# Patient Record
Sex: Male | Born: 1985 | Race: White | Hispanic: No | Marital: Married | Smoking: Never smoker
Health system: Southern US, Community
[De-identification: ages and names within clinical notes are randomized; demographics above are authoritative.]

---
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
## 2004-03-18 ENCOUNTER — Emergency Department (HOSPITAL_COMMUNITY): Admission: EM | Admit: 2004-03-18 | Discharge: 2004-03-18 | Payer: Self-pay | Admitting: Emergency Medicine

## 2004-05-13 ENCOUNTER — Emergency Department (HOSPITAL_COMMUNITY): Admission: EM | Admit: 2004-05-13 | Discharge: 2004-05-13 | Payer: Self-pay | Admitting: Emergency Medicine

## 2006-09-21 ENCOUNTER — Encounter: Payer: Self-pay | Admitting: Emergency Medicine

## 2006-09-21 ENCOUNTER — Observation Stay (HOSPITAL_COMMUNITY): Admission: EM | Admit: 2006-09-21 | Discharge: 2006-09-22 | Payer: Self-pay | Admitting: Emergency Medicine
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
## 2012-03-22 ENCOUNTER — Emergency Department (HOSPITAL_COMMUNITY): Payer: Self-pay

## 2012-03-22 ENCOUNTER — Encounter (HOSPITAL_COMMUNITY): Payer: Self-pay | Admitting: *Deleted

## 2012-03-22 ENCOUNTER — Emergency Department (HOSPITAL_COMMUNITY)
Admission: EM | Admit: 2012-03-22 | Discharge: 2012-03-22 | Disposition: A | Discharge status: Home / Self Care | Payer: Self-pay | Attending: Emergency Medicine | Admitting: Emergency Medicine

## 2012-03-22 DIAGNOSIS — N132 Hydronephrosis with renal and ureteral calculous obstruction: Secondary | ICD-10-CM

## 2012-03-22 DIAGNOSIS — N509 Disorder of male genital organs, unspecified: Secondary | ICD-10-CM | POA: Insufficient documentation

## 2012-03-22 DIAGNOSIS — N201 Calculus of ureter: Secondary | ICD-10-CM | POA: Insufficient documentation

## 2012-03-22 DIAGNOSIS — R8271 Bacteriuria: Secondary | ICD-10-CM

## 2012-03-22 DIAGNOSIS — N1330 Unspecified hydronephrosis: Secondary | ICD-10-CM | POA: Insufficient documentation

## 2012-03-22 DIAGNOSIS — R82998 Other abnormal findings in urine: Secondary | ICD-10-CM | POA: Insufficient documentation

## 2012-03-22 DIAGNOSIS — R112 Nausea with vomiting, unspecified: Secondary | ICD-10-CM | POA: Insufficient documentation

## 2012-03-22 LAB — URINE MICROSCOPIC-ADD ON

## 2012-03-22 LAB — URINALYSIS, ROUTINE W REFLEX MICROSCOPIC
Bilirubin Urine: NEGATIVE
Glucose, UA: NEGATIVE mg/dL
Ketones, ur: NEGATIVE mg/dL
pH: 6 (ref 5.0–8.0)

## 2012-03-22 MED ORDER — ONDANSETRON HCL 4 MG/2ML IJ SOLN
INTRAMUSCULAR | Status: AC
  Administered 2012-03-22: 4 mg via INTRAVENOUS
  Filled 2012-03-22: qty 2

## 2012-03-22 MED ORDER — HYDROMORPHONE HCL PF 1 MG/ML IJ SOLN
1.0000 mg | Freq: Once | INTRAMUSCULAR | Status: AC
  Administered 2012-03-22: 1 mg via INTRAVENOUS

## 2012-03-22 MED ORDER — HYDROMORPHONE HCL PF 1 MG/ML IJ SOLN
INTRAMUSCULAR | Status: AC
  Administered 2012-03-22: 1 mg via INTRAVENOUS
  Filled 2012-03-22: qty 1

## 2012-03-22 MED ORDER — ONDANSETRON HCL 4 MG/2ML IJ SOLN
INTRAMUSCULAR | Status: AC
  Filled 2012-03-22: qty 2

## 2012-03-22 MED ORDER — CIPROFLOXACIN HCL 500 MG PO TABS: 500.0000 mg | ORAL_TABLET | Freq: Two times a day (BID) | ORAL | Status: AC

## 2012-03-22 MED ORDER — ONDANSETRON HCL 4 MG/2ML IJ SOLN
4.0000 mg | Freq: Once | INTRAMUSCULAR | Status: AC
  Administered 2012-03-22: 4 mg via INTRAVENOUS

## 2012-03-22 MED ORDER — ONDANSETRON HCL 8 MG PO TABS: 8.0000 mg | ORAL_TABLET | Freq: Three times a day (TID) | ORAL | Status: AC | PRN

## 2012-03-22 MED ORDER — OXYCODONE-ACETAMINOPHEN 5-325 MG PO TABS: 1.0000 | ORAL_TABLET | ORAL | Status: AC | PRN

## 2012-03-22 MED ORDER — ONDANSETRON HCL 4 MG/2ML IJ SOLN: 4.0000 mg | Freq: Once | INTRAMUSCULAR | Status: DC

## 2012-03-22 MED ORDER — KETOROLAC TROMETHAMINE 30 MG/ML IJ SOLN
30.0000 mg | Freq: Once | INTRAMUSCULAR | Status: AC
  Administered 2012-03-22: 30 mg via INTRAVENOUS
  Filled 2012-03-22: qty 1

## 2012-03-22 NOTE — ED Notes (Signed)
Left flank pain that started this morning with n/v.  Denies hx of kidney stone.

## 2012-03-22 NOTE — ED Notes (Signed)
Pt unable to void at this time.  nad noted.  

## 2012-03-22 NOTE — ED Provider Notes (Signed)
History     CSN: [id number]  Arrival date & time 03/22/12  4098   First MD Initiated Contact with Patient 03/22/12 [phone]      Chief Complaint  Patient presents with  . Flank Pain    (Consider location/radiation/quality/duration/timing/severity/associated sxs/prior treatment) HPI Comments: Martin Palmer presents with sudden onset, constant left flank pain which is now radiating into his left testicle.  He has nausea with emesis.  He has not been able to urinate since the symptoms started.  He denies fevers, chills, abdominal pain pain.  He denies a prior history or family history of kidney stones and denies penile discharge or scrotal swelling.  Patient is a 26 y.o. male presenting with flank pain. The history is provided by the patient.  Flank Pain This is a new problem. The current episode started today. The problem occurs constantly. The problem has been unchanged. Associated symptoms include nausea and vomiting. Pertinent negatives include no abdominal pain, arthralgias, chest pain, chills, congestion, fever, headaches, joint swelling, neck pain, numbness, rash, sore throat or weakness. He has tried nothing for the symptoms.    History reviewed. No pertinent past medical history.  History reviewed. No pertinent past surgical history.  No family history on file.  History  Substance Use Topics  . Smoking status: Never Smoker   . Smokeless tobacco: Not on file  . Alcohol Use: No      Review of Systems  Constitutional: Negative for fever and chills.  HENT: Negative for congestion, sore throat and neck pain.   Eyes: Negative.   Respiratory: Negative for chest tightness and shortness of breath.   Cardiovascular: Negative for chest pain.  Gastrointestinal: Positive for nausea and vomiting. Negative for abdominal pain.  Genitourinary: Positive for flank pain, difficulty urinating and testicular pain.  Musculoskeletal: Negative for joint swelling and arthralgias.  Skin:  Negative.  Negative for rash and wound.  Neurological: Negative for dizziness, weakness, light-headedness, numbness and headaches.  Hematological: Negative.   Psychiatric/Behavioral: Negative.     Allergies  Review of patient's allergies indicates no known allergies.  Home Medications   Current Outpatient Rx  Name  Route  Sig  Dispense  Refill  . CIPROFLOXACIN HCL 500 MG PO TABS   Oral   Take 1 tablet (500 mg total) by mouth every 12 (twelve) hours.   14 tablet   0   . ONDANSETRON HCL 8 MG PO TABS   Oral   Take 1 tablet (8 mg total) by mouth every 8 (eight) hours as needed for nausea.   12 tablet   0   . OXYCODONE-ACETAMINOPHEN 5-325 MG PO TABS   Oral   Take 1 tablet by mouth every 4 (four) hours as needed for pain.   20 tablet   0     BP 123/68  Pulse 50  Temp 97.8 F (36.6 C) (Oral)  Resp 16  Ht 5\' 7"  (1.702 m)  Wt 180 lb (81.647 kg)  BMI 28.19 kg/m2  SpO2 97%  Physical Exam  Nursing note and vitals reviewed. Constitutional: He appears well-developed and well-nourished.  HENT:  Head: Normocephalic and atraumatic.  Eyes: Conjunctivae normal are normal.  Neck: Normal range of motion.  Cardiovascular: Normal rate, regular rhythm, normal heart sounds and intact distal pulses.   Pulmonary/Chest: Effort normal and breath sounds normal. He has no wheezes.  Abdominal: Soft. Bowel sounds are normal. He exhibits no mass. There is tenderness. There is CVA tenderness. There is no rebound and no guarding.  Genitourinary: Testes normal.  Musculoskeletal: Normal range of motion.  Neurological: He is alert.  Skin: Skin is warm and dry.  Psychiatric: He has a normal mood and affect.    ED Course  Procedures (including critical care time)  Labs Reviewed  URINALYSIS, ROUTINE W REFLEX MICROSCOPIC - Abnormal; Notable for the following:    Specific Gravity, Urine >1.030 (*)     Hgb urine dipstick LARGE (*)     All other components within normal limits  URINE  MICROSCOPIC-ADD ON - Abnormal; Notable for the following:    Bacteria, UA FEW (*)     All other components within normal limits  URINE CULTURE   Ct Abdomen Pelvis Wo Contrast  03/22/2012  *RADIOLOGY REPORT*  Clinical Data: left flank pain  CT ABDOMEN AND PELVIS WITHOUT CONTRAST  Technique:  Multidetector CT imaging of the abdomen and pelvis was performed following the standard protocol without intravenous contrast.  Comparison: None.  Findings: The lung bases are clear.  Normal heart size.  No pericardial or pleural effusion.  Negative for hiatal hernia.  Abdomen: Left kidney demonstrates mild hydronephrosis and hydroureter.  This is secondary to a left distal UVJ obstructing calculus in the pelvis measuring 3 mm, image 76.  No other urinary tract demonstrated.  Right kidney and ureter are unremarkable.  Liver, gallbladder, biliary system, pancreas, spleen, and adrenal glands are within normal limits for noncontrast study.  Negative for bowel obstruction, dilatation, ileus, or free air.  No abdominal free fluid, fluid collection, hemorrhage, abscess, or adenopathy.  Normal appendix demonstrated.  Pelvis:  3 mm left UVJ obstructing calculus again demonstrated.  No free fluid, fluid collection, hemorrhage, abscess or adenopathy. No inguinal abnormality, or hernia.  No acute distal bowel process.  IMPRESSION: 3 mm left UVJ mildly obstructing calculus with associated left hydronephrosis and hydroureter.   Original Report Authenticated By: Judie Petit. Shick, M.D.      1. Ureteral stone with hydronephrosis   2. Bacteriuria with pyuria       MDM  Pt prescribed cipro,  Oxycodone,  Zofran.  Kidney stone instructions given,  Referral to Dr Jerre Simon, urine strainer given.  Return here for worse pain,  Fever, uncontrolled vomiting.  Urine cx pending.        Burgess Amor, [place] 03/22/12 [phone]

## 2012-03-23 LAB — URINE CULTURE: Colony Count: NO GROWTH

## 2012-03-23 NOTE — ED Provider Notes (Signed)
Medical screening examination/treatment/procedure(s) were performed by non-physician practitioner and as supervising physician I was immediately available for consultation/collaboration.   Benny Lennert, MD 03/23/12 1537

## 2014-12-22 ENCOUNTER — Ambulatory Visit
Admission: EM | Admit: 2014-12-22 | Discharge: 2014-12-22 | Disposition: A | Discharge status: Home / Self Care | Payer: 59 | Attending: Family Medicine | Admitting: Family Medicine

## 2014-12-22 DIAGNOSIS — R110 Nausea: Secondary | ICD-10-CM

## 2014-12-22 MED ORDER — ONDANSETRON HCL 4 MG/2ML IJ SOLN: 8.0000 mg | Freq: Once | INTRAMUSCULAR | Status: DC

## 2014-12-22 MED ORDER — ONDANSETRON 8 MG PO TBDP
8.0000 mg | ORAL_TABLET | Freq: Once | ORAL | Status: AC
  Administered 2014-12-22: 8 mg via ORAL
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
## 2014-12-24 ENCOUNTER — Ambulatory Visit
Admission: EM | Admit: 2014-12-24 | Discharge: 2014-12-24 | Disposition: A | Discharge status: Home / Self Care | Payer: 59 | Attending: Family Medicine | Admitting: Family Medicine

## 2014-12-24 ENCOUNTER — Encounter: Payer: Self-pay | Admitting: Emergency Medicine

## 2014-12-24 DIAGNOSIS — Z113 Encounter for screening for infections with a predominantly sexual mode of transmission: Secondary | ICD-10-CM

## 2014-12-24 LAB — CHLAMYDIA/NGC RT PCR (ARMC ONLY)
Chlamydia Tr: NOT DETECTED
N gonorrhoeae: NOT DETECTED

## 2014-12-24 NOTE — ED Provider Notes (Signed)
[facility] Emergency Department Provider Note  ____________________________________________  Time seen: Approximately 2:24 PM  I have reviewed the triage vital signs and the nursing notes.   HISTORY  Chief Complaint Exposure to STD    HPI KILE KABLER is a 29 y.o. male presents for request of STD testing. Patient reports that 1.5 weeks ago on last Wednesday he was drinking alcohol and received oral sex. States he only received oral sex. States no intercourse, giving of oral sex, rectal involvement or other sexual activity. States this occurred once.   Reports he was seen here at Urgent Care 2 days ago for same and was experiencing nausea with same as he was nervous, but states nausea resolved.  Denies complaints. Denies pain, dysuria, penile discharge, rash, lesions, or other complaints. Denies fever.     History reviewed. No pertinent past medical history.  There are no active problems to display for this patient.   History reviewed. No pertinent past surgical history.  Current Outpatient Rx  Name  Route  Sig  Dispense  Refill  .           Marland Kitchen             Allergies Review of patient's allergies indicates no known allergies.  History reviewed. No pertinent family history.  Social History Social History  Substance Use Topics  . Smoking status: Never Smoker   . Smokeless tobacco: None  . Alcohol Use: 0.6 oz/week    1 Cans of beer per week    Review of Systems Constitutional: No fever/chills Eyes: No visual changes. ENT: No sore throat. Cardiovascular: Denies chest pain. Respiratory: Denies shortness of breath. Gastrointestinal: No abdominal pain.  No nausea, no vomiting.  No diarrhea.  No constipation. Genitourinary: Negative for dysuria. Musculoskeletal: Negative for back pain. Skin: Negative for rash. Neurological: Negative for headaches, focal weakness or numbness.  10-point ROS otherwise  negative.  ____________________________________________   PHYSICAL EXAM:  VITAL SIGNS: ED Triage Vitals  Enc Vitals Group     BP 12/24/14 1355 115/92 mmHg     Pulse Rate 12/24/14 1355 69     Resp 12/24/14 1355 18     Temp 12/24/14 1355 98 F (36.7 C)     Temp Source 12/24/14 1355 Tympanic     SpO2 12/24/14 1355 100 %     Weight 12/24/14 1355 188 lb (85.276 kg)     Height 12/24/14 1355 5\' 6"  (1.676 m)     Head Cir --      Peak Flow --      Pain Score --      Pain Loc --      Pain Edu? --      Excl. in GC? --     Constitutional: Alert and oriented. Well appearing and in no acute distress. Eyes: Conjunctivae are normal. PERRL. EOMI. Head: Atraumatic.  Nose: No congestion/rhinnorhea.  Mouth/Throat: Mucous membranes are moist.  Oropharynx non-erythematous. Neck: No stridor.  No cervical spine tenderness to palpation. Hematological/Lymphatic/Immunilogical: No cervical lymphadenopathy. Cardiovascular: Normal rate, regular rhythm. Grossly normal heart sounds.  Good peripheral circulation. Respiratory: Normal respiratory effort.  No retractions. Lungs CTAB. Gastrointestinal: Soft and nontender. No distention. Normal Bowel sounds.   No CVA tenderness. Musculoskeletal: No lower or upper extremity tenderness nor edema.  No joint effusions. Bilateral pedal pulses equal and easily palpated.  Male: With RN Jim at bedside. Circumcised. Testicles nontender, no mass or bulge. No rash, lesions, discharge. Normal exam.  Neurologic:  Normal  speech and language. No gross focal neurologic deficits are appreciated. No gait instability. Skin:  Skin is warm, dry and intact. No rash noted. Psychiatric: Mood and affect are normal. Speech and behavior are normal.  ____________________________________________   LABS (all labs ordered are listed, but only abnormal results are displayed)  Labs Reviewed  CHLAMYDIA/NGC RT PCR ([facility] ONLY)  RAPID HIV SCREEN (HIV 1/2 AB+AG)  RPR  HSV(HERPES  SIMPLEX VRS) I + II AB-IGG  HSV(HERPES SIMPLEX VRS) I + II AB-IGM  HEPATITIS PANEL, ACUTE   ___________________________________________   INITIAL IMPRESSION / ASSESSMENT AND PLAN / ED COURSE  Pertinent labs & imaging results that were available during my care of the patient were reviewed by me and considered in my medical decision making (see chart for details).  No acute distress. Denies complaints. Reports here for STD testing post receiving oral sex last week. Denies other sexual activity. Reports told wife and now here for testing. Denies complaints. Will test gonorrhea, chlamydia, herpes, syphilis, hepatitis, HIV. Discussed with patient to call back early next week for follow up. Discussed to follow up with PCP or [facility] STD clinic(information given). Patient verbalized understanding and agreed to plan.  ____________________________________________   FINAL CLINICAL IMPRESSION(S) / ED DIAGNOSES  Final diagnoses:  Screening for STD (sexually transmitted disease)       Renford Dills, NP 12/24/14 1501

## 2014-12-24 NOTE — Discharge Instructions (Signed)
Follow up with your primary care physician or [facility] Department's STD clinic as needed. Call Urgent care to follow up next week as discussed. Return to Urgent Care as needed for new or worsening concerns.

## 2014-12-24 NOTE — ED Notes (Signed)
Pt wants to have std testing

## 2014-12-25 LAB — HIV ANTIBODY (ROUTINE TESTING W REFLEX): HIV SCREEN 4TH GENERATION: NONREACTIVE

## 2014-12-28 LAB — HSV(HERPES SIMPLEX VRS) I + II AB-IGM: HSVI/II Comb IgM: 1.57 Ratio — ABNORMAL HIGH (ref 0.00–0.90)

## 2014-12-28 LAB — HSV(HERPES SIMPLEX VRS) I + II AB-IGG: HSV 1 GLYCOPROTEIN G AB, IGG: 39.8 {index} — AB (ref 0.00–0.90)

## 2014-12-28 LAB — RPR: RPR: NONREACTIVE

## 2014-12-30 ENCOUNTER — Telehealth: Payer: Self-pay | Admitting: Emergency Medicine

## 2014-12-30 NOTE — Telephone Encounter (Signed)
Called patient to follow up and discuss results, no answer. Voicemail left to call back to discuss. Second voicemail left (also left voicemail yesterday).

## 2014-12-31 LAB — HEPATITIS PANEL, ACUTE
HEP A IGM: NEGATIVE
HEP B S AG: NEGATIVE
Hep B C IgM: NEGATIVE

## 2015-01-05 ENCOUNTER — Telehealth: Payer: Self-pay | Admitting: Emergency Medicine

## 2015-01-05 NOTE — Telephone Encounter (Signed)
Attempted to contact again to discuss all results. Patient did not answer. Voicemail left with directions to call back and return phone number left in message.

## 2015-01-06 NOTE — Telephone Encounter (Signed)
Note opened in error. No further documentation.

## 2015-01-26 ENCOUNTER — Other Ambulatory Visit (HOSPITAL_COMMUNITY): Payer: Self-pay | Admitting: Internal Medicine

## 2015-01-26 DIAGNOSIS — E291 Testicular hypofunction: Secondary | ICD-10-CM

## 2015-01-26 DIAGNOSIS — G4459 Other complicated headache syndrome: Secondary | ICD-10-CM

## 2015-02-07 ENCOUNTER — Ambulatory Visit (HOSPITAL_COMMUNITY)
Admission: RE | Admit: 2015-02-07 | Discharge: 2015-02-07 | Disposition: A | Discharge status: Home / Self Care | Payer: 59 | Source: Ambulatory Visit | Attending: Internal Medicine | Admitting: Internal Medicine

## 2015-02-07 DIAGNOSIS — E291 Testicular hypofunction: Secondary | ICD-10-CM | POA: Insufficient documentation

## 2015-02-07 DIAGNOSIS — R51 Headache: Secondary | ICD-10-CM | POA: Insufficient documentation

## 2015-02-07 DIAGNOSIS — G4459 Other complicated headache syndrome: Secondary | ICD-10-CM

## 2015-02-07 MED ORDER — GADOBENATE DIMEGLUMINE 529 MG/ML IV SOLN
8.0000 mL | Freq: Once | INTRAVENOUS | Status: AC | PRN
  Administered 2015-02-07: 8 mL via INTRAVENOUS

## 2016-12-09 IMAGING — MR MR HEAD WO/W CM
15 of 20 series · 25 of 48 positions shown · IV contrast (multihance)
Comparison: 09/22/2006 head CT.  No comparison brain MR.

CLINICAL DATA: 29-year-old male with headache, weakness and
hypogonadism with decreased testosterone.

EXAM:
MRI HEAD WITHOUT AND WITH CONTRAST
TECHNIQUE: Multiplanar, multiecho pulse sequences of the brain and surrounding
structures were obtained without and with intravenous contrast.
CONTRAST:  8mL MULTIHANCE GADOBENATE DIMEGLUMINE 529 MG/ML IV SOLN

[Series 2: t1_fl2d_sag · sagittal · 5.0mm · 0.45mm/px · 1 of 20 slices shown]
[im 1/20]
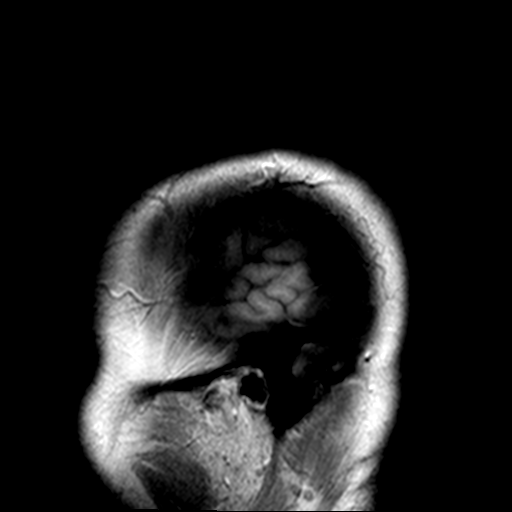

[Series 5: T2 · axial · 5.0mm · 0.51mm/px · 1 of 25 slices shown]
[im 1/25]
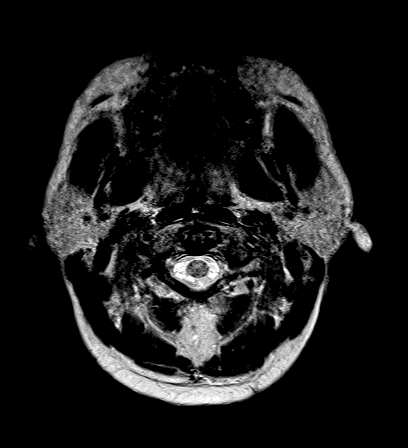

[Series 6: t1_se_cor_3mm · coronal · 3.0mm · 0.37mm/px · 1 of 13 slices shown (1 of 2)]
[im 1/13]
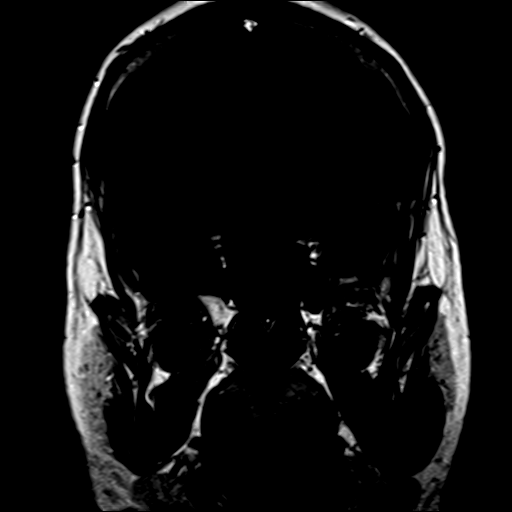

[Series 7: FLAIR · axial · 5.0mm · 0.94mm/px · 1 of 25 slices shown]
[im 1/25]
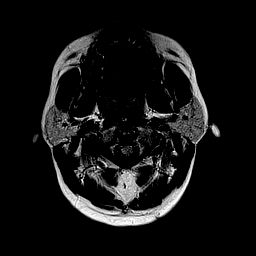

[Series 8: trauma axial · axial · 5.0mm · 0.45mm/px · 1 of 23 slices shown]
[im 1/23]
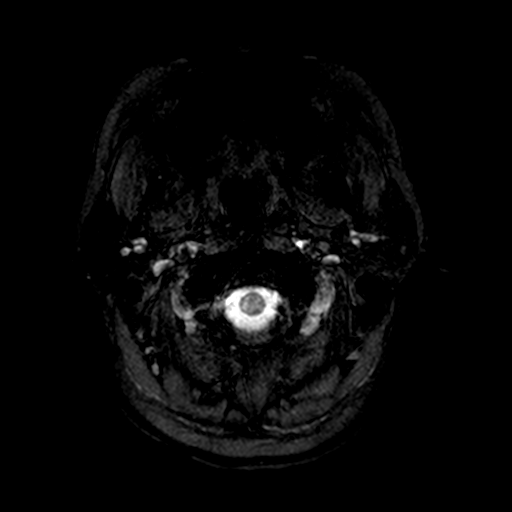

[Series 9: t1_tse_sag_3mm · sagittal · 3.0mm · 0.37mm/px · 1 of 13 slices shown]
[im 1/13]
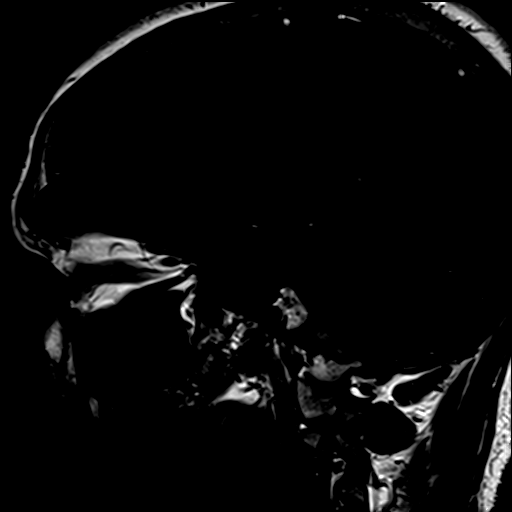

[Series 10: t1_tse_cor_dynamic · coronal · 3.0mm · 0.49mm/px · 1 of 5 slices shown (1 of 6)]
[im 1/5]
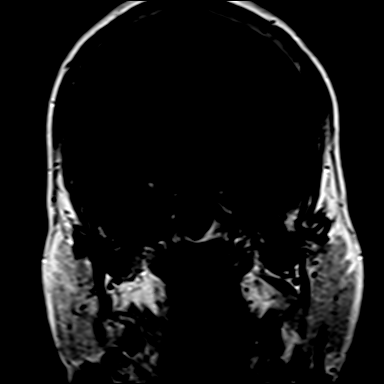

[Series 11: t1_tse_cor_dynamic · coronal · 3.0mm · 0.49mm/px · 1 of 5 slices shown (2 of 6)]
[im 1/5]
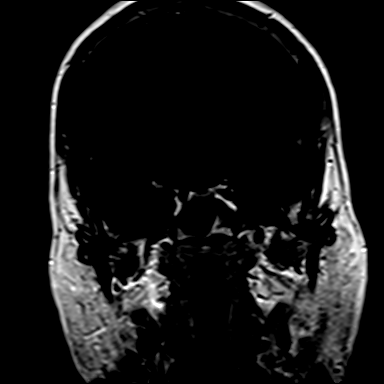

[Series 12: t1_tse_cor_dynamic · coronal · 3.0mm · 0.49mm/px · 1 of 5 slices shown (3 of 6)]
[im 1/5]
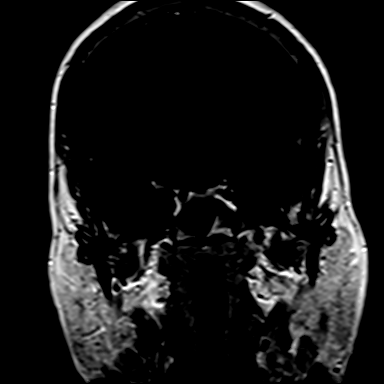

[Series 13: t1_tse_cor_dynamic · coronal · 3.0mm · 0.49mm/px · 1 of 5 slices shown (4 of 6)]
[im 1/5]
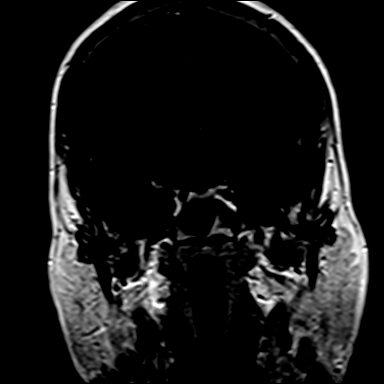

[Series 14: t1_tse_cor_dynamic · coronal · 3.0mm · 0.49mm/px · 1 of 5 slices shown (5 of 6)]
[im 1/5]
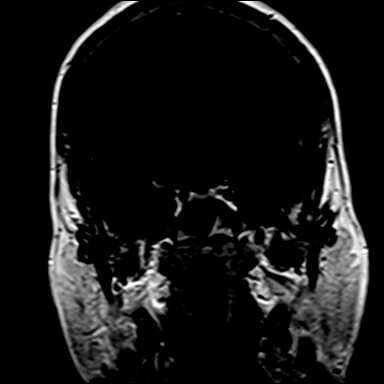

[Series 15: t1_tse_cor_dynamic · coronal · 3.0mm · 0.49mm/px · 1 of 5 slices shown (6 of 6)]
[im 1/5]
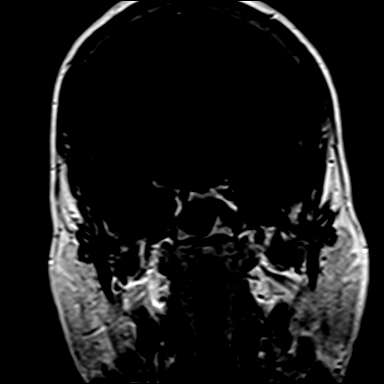

[Series 16: t1_se_cor_3mm · coronal · 3.0mm · 0.37mm/px · 1 of 13 slices shown (2 of 2)]
[im 1/13]
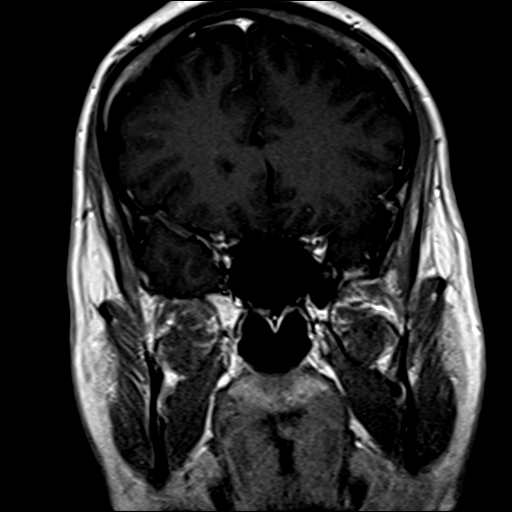

[Series 18: T1 post-contrast · axial · 2.0mm · 0.45mm/px · z∈[-140,+47]mm · 9 of 95 slices shown (1 of 2)]
[im 1/95]
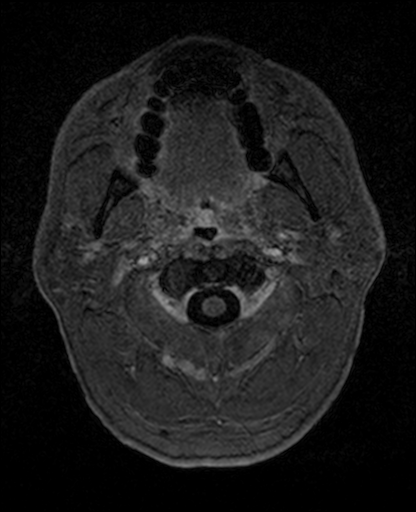
[im 12/95]
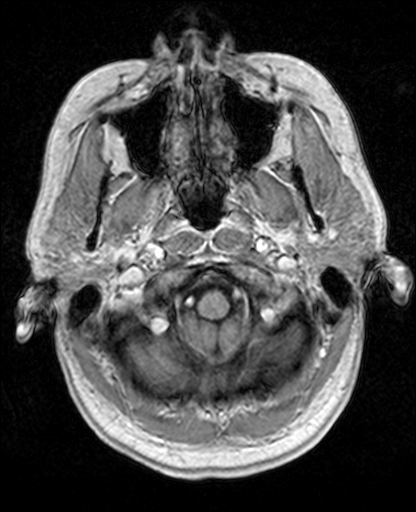
[im 24/95]
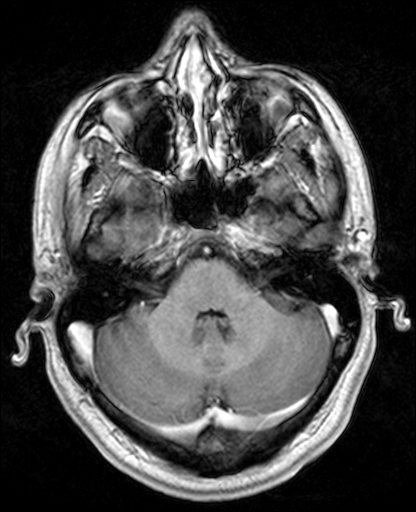
[im 36/95]
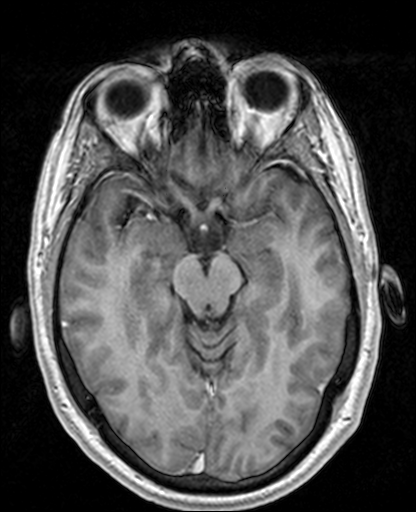
[im 48/95]
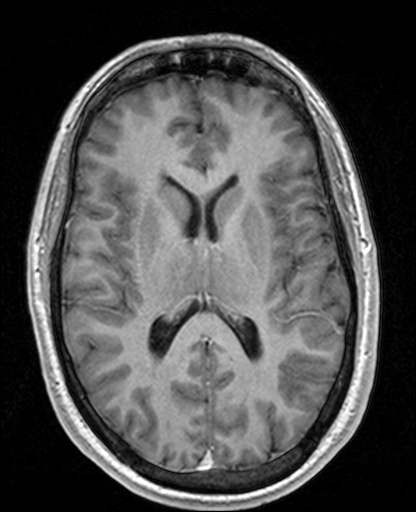
[im 59/95]
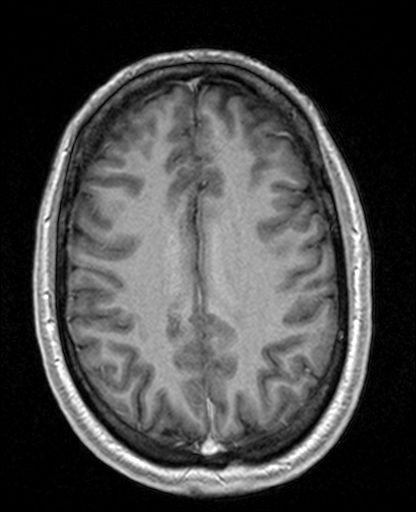
[im 71/95]
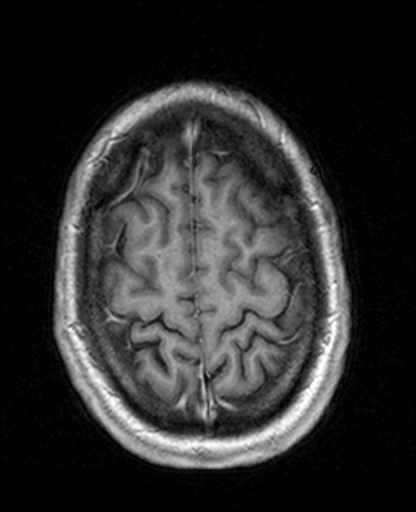
[im 83/95]
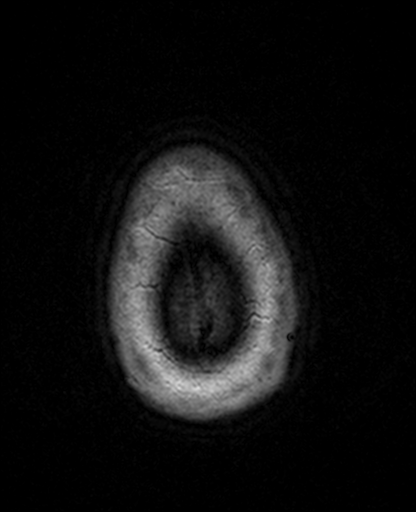
[im 95/95]
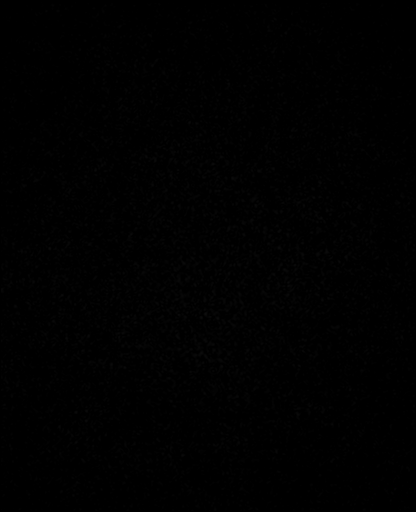

[Series 19: T1 post-contrast · coronal · 5.0mm · 0.41mm/px · 3 of 28 slices shown (2 of 2)]
[im 1/28]
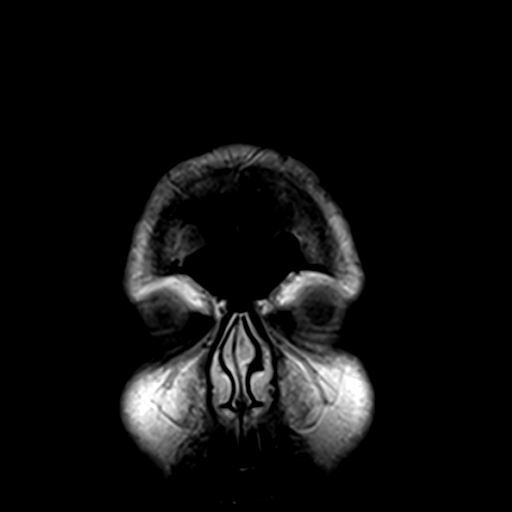
[im 14/28]
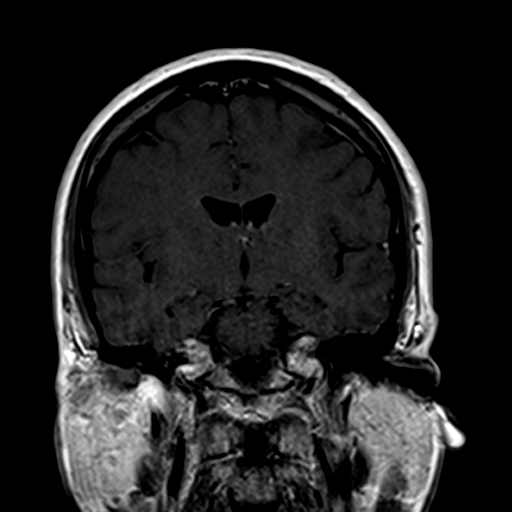
[im 28/28]
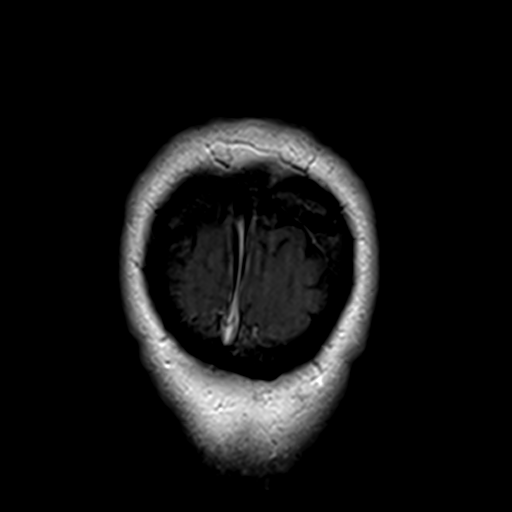

[25 of 48 positions shown; findings below may reference images not displayed]

FINDINGS: Sequences are motion degraded.

Non expanded sella is partially empty. The infundibulum is minimally
deviated to left. No pituitary mass is identified.

No acute infarct.

No intracranial hemorrhage.

No hydrocephalus.

No intracranial mass or abnormal enhancement.

Major intracranial vascular structures are patent.

Cervical medullary junction, orbital structures and pineal region
unremarkable.

Minimal mucosal thickening ethmoid sinus air cells.
IMPRESSION: Sequences are motion degraded.

Partially empty non expanded sella with infundibulum minimally
deviated to left. No pituitary mass is identified.

No acute infarct.

No intracranial hemorrhage.

No hydrocephalus.

No intracranial mass or abnormal enhancement.
